# Patient Record
Sex: Male | Born: 2000 | Race: Black or African American | Hispanic: No | Marital: Single | Smoking: Current every day smoker
Health system: Southern US, Community
[De-identification: ages and names within clinical notes are randomized; demographics above are authoritative.]

## PROBLEM LIST (undated history)

## (undated) DIAGNOSIS — F329 Major depressive disorder, single episode, unspecified: Secondary | ICD-10-CM

## (undated) DIAGNOSIS — F32A Depression, unspecified: Secondary | ICD-10-CM

## (undated) DIAGNOSIS — F419 Anxiety disorder, unspecified: Secondary | ICD-10-CM

---
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
## 2020-05-10 ENCOUNTER — Other Ambulatory Visit (HOSPITAL_COMMUNITY): Payer: Self-pay | Admitting: Psychiatry

## 2020-05-10 DIAGNOSIS — F331 Major depressive disorder, recurrent, moderate: Secondary | ICD-10-CM

## 2020-07-09 IMAGING — DX DG CHEST 1V PORT
1 series · 1 of 1 positions shown · non-contrast
Comparison: 10/04/2018

CLINICAL DATA: Chest tube, pneumothorax

EXAM:
PORTABLE CHEST 1 VIEW

[chest ap]
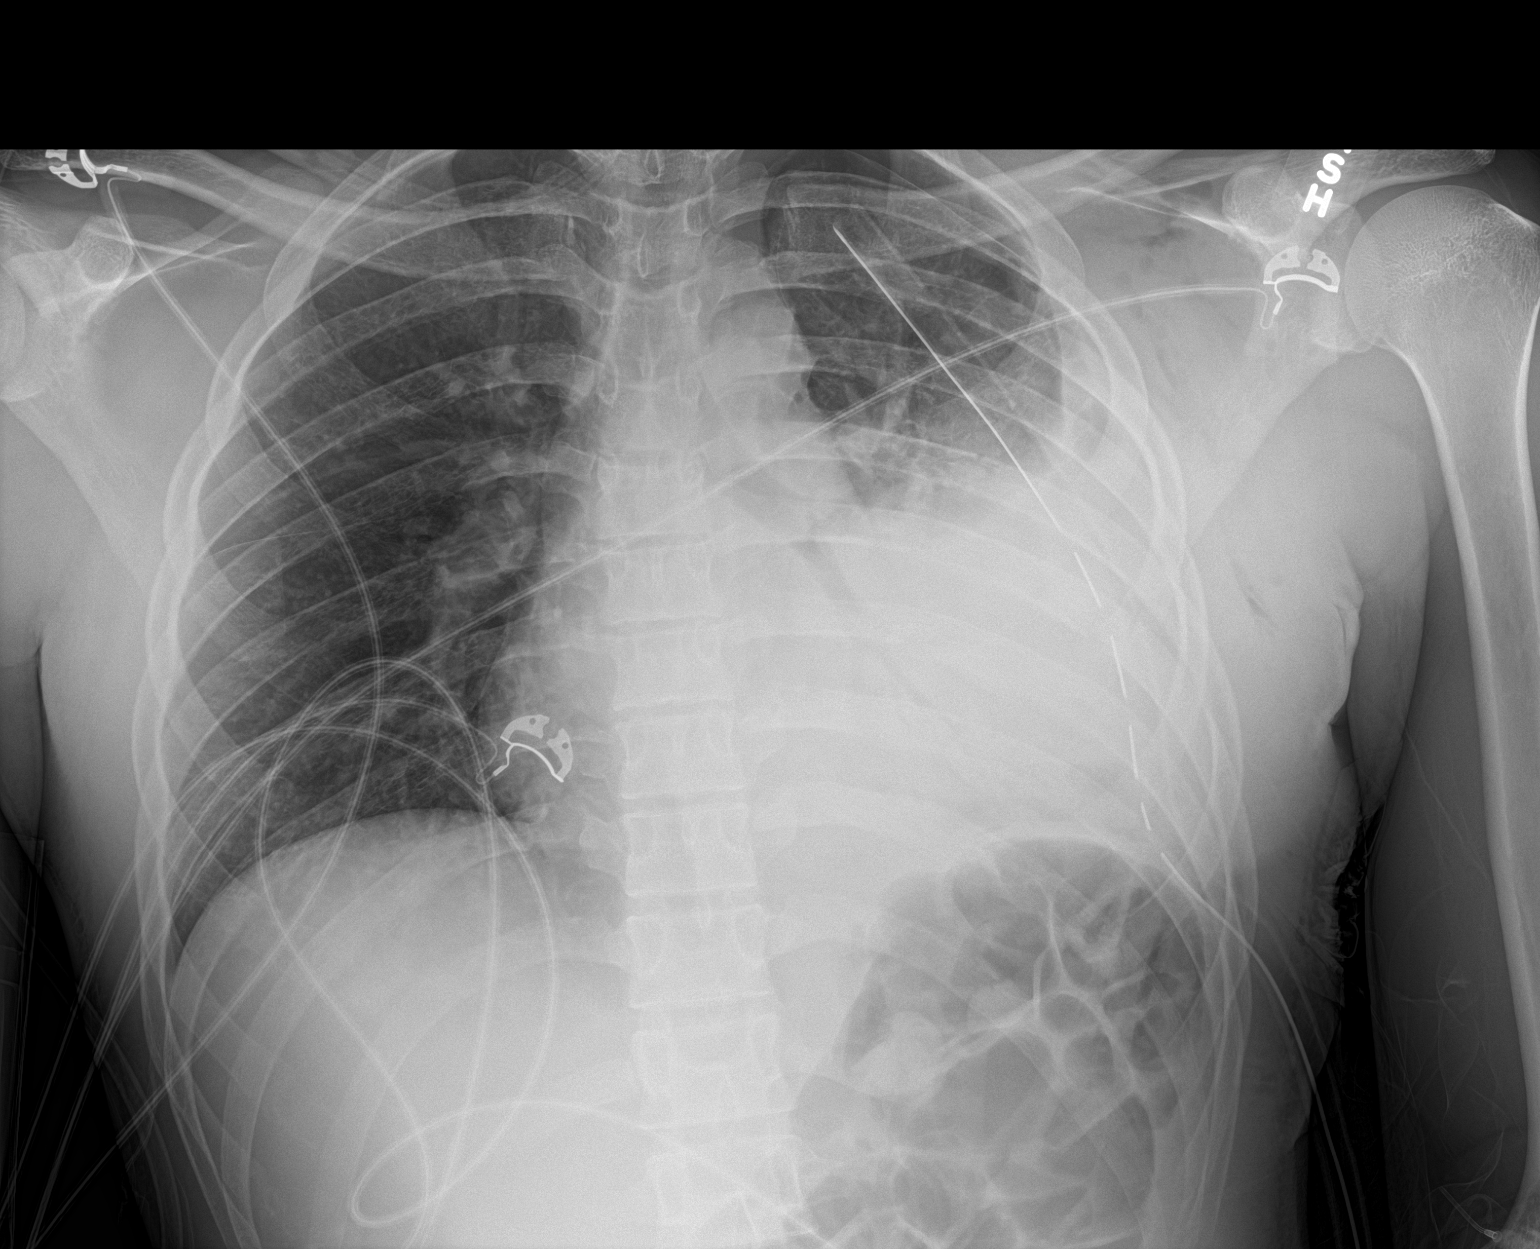

[1 of 1 positions shown; findings below may reference images not displayed]

FINDINGS: Very slight interval increase in a small left apical pneumothorax
component, 5-10%. Left-sided chest tube remains in position with a
large left pleural effusion and extensive atelectasis or
consolidation of the left lung. Right lung is normally aerated.
Heart and mediastinum are unremarkable.
IMPRESSION: Very slight interval increase in a small left apical pneumothorax
component, 5-10%. Left-sided chest tube remains in position with a
large left pleural effusion and extensive atelectasis or
consolidation of the left lung.

## 2020-07-10 IMAGING — DX DG CHEST 1V PORT
1 series · 1 of 1 positions shown · non-contrast
Comparison: 10/05/2018

CLINICAL DATA: Follow-up chest tube

EXAM:
PORTABLE CHEST 1 VIEW

[chest ap]
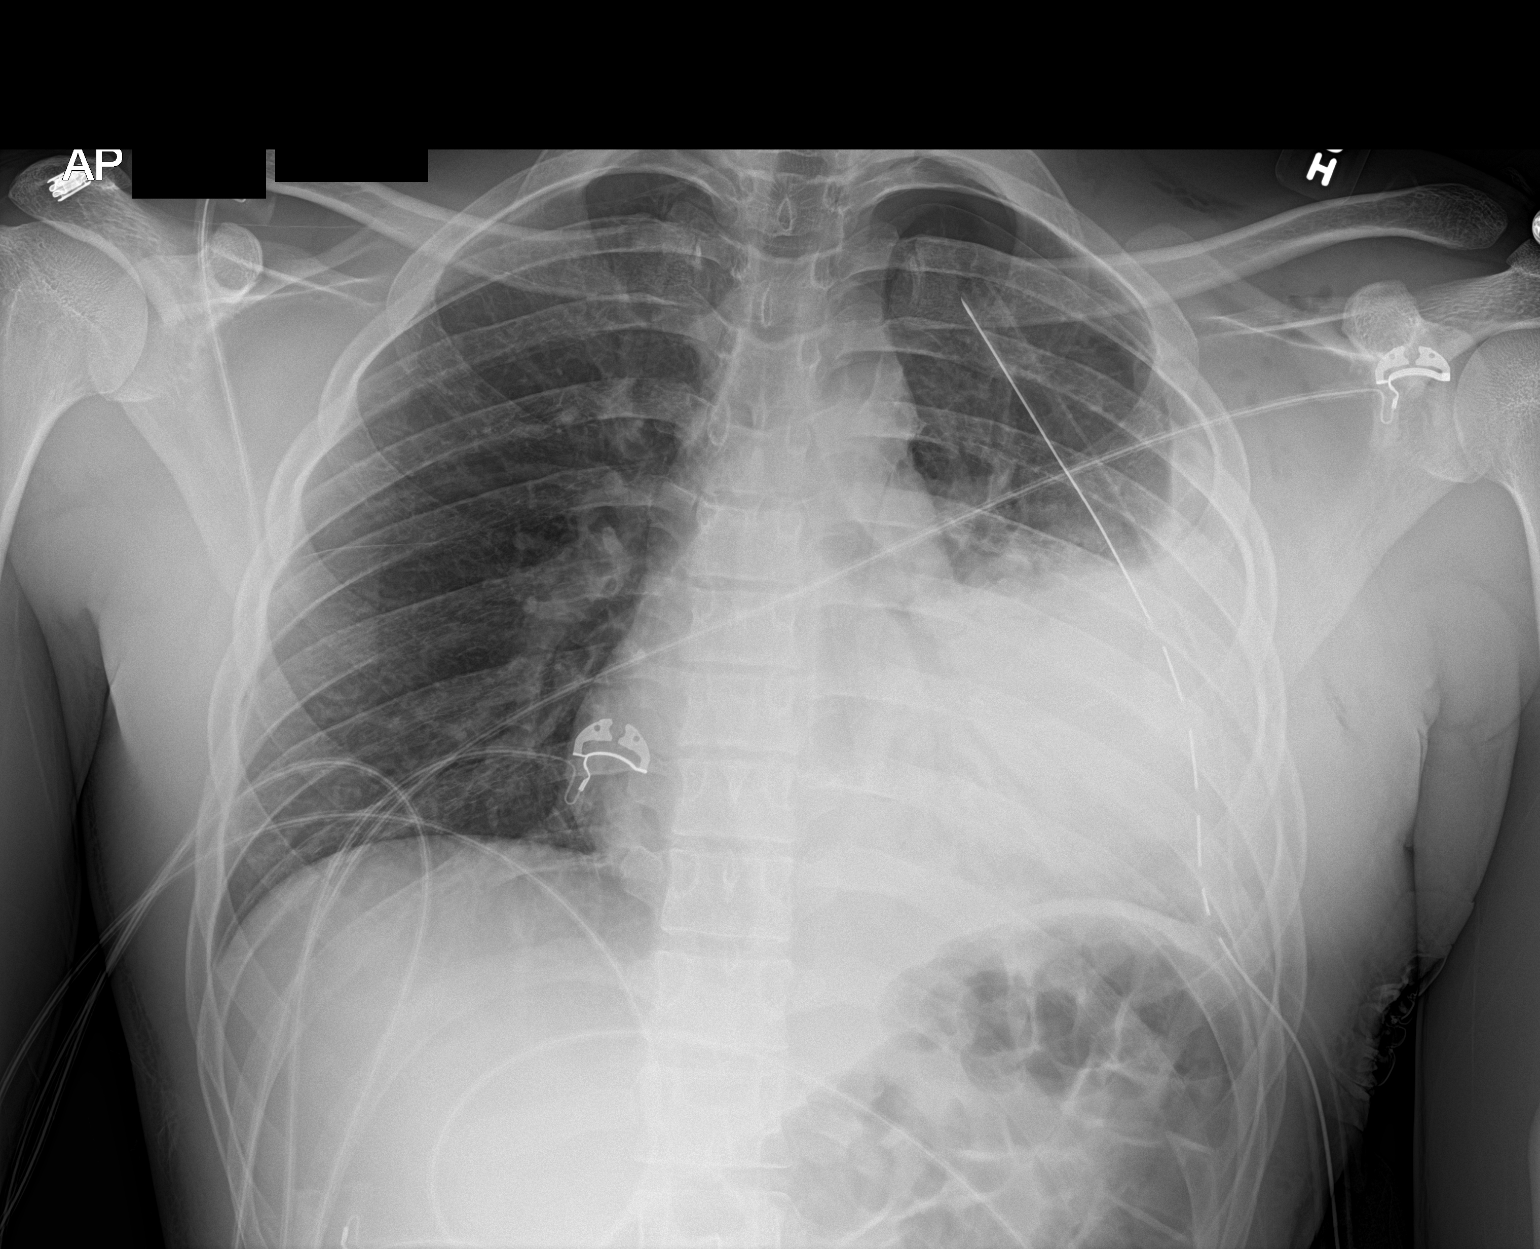

[1 of 1 positions shown; findings below may reference images not displayed]

FINDINGS: Cardiac shadow is stable. Left-sided chest tube is again noted in
satisfactory position. Persistent density in the left base is noted
consistent with the consolidation seen on recent chest CT. Small
apical pneumothorax is again identified and stable. Right lung
remains clear. No acute bony abnormality is seen.
IMPRESSION: Stable consolidation in the mid and lower left lung field.

Chest tube in place with small stable left apical pneumothorax.
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
## 2021-10-05 ENCOUNTER — Encounter (HOSPITAL_COMMUNITY): Payer: Self-pay | Admitting: *Deleted

## 2021-10-05 ENCOUNTER — Ambulatory Visit (HOSPITAL_COMMUNITY)
Admission: EM | Admit: 2021-10-05 | Discharge: 2021-10-05 | Disposition: A | Discharge status: Home / Self Care | Payer: Medicaid Other | Attending: Physician Assistant | Admitting: Physician Assistant

## 2021-10-05 DIAGNOSIS — N342 Other urethritis: Secondary | ICD-10-CM

## 2021-10-05 DIAGNOSIS — Z113 Encounter for screening for infections with a predominantly sexual mode of transmission: Secondary | ICD-10-CM
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
## 2021-10-05 NOTE — Discharge Instructions (Signed)
Lab results will be completed in 48 hours.  If you do not hear from this office within 48 hours that indicates lab tests are negative.  You can go on MyChart to be the results when they post in 48 hours. Advised to abstain from any sexual activity until after you get the results of the testing are treated if required.  Advised to use protection with all sexual contacts. Advised to follow-up with PCP or return to urgent care if symptoms fail to improve.

## 2021-10-05 NOTE — ED Triage Notes (Signed)
Patient states he started having discharge from his penis this morning. He denies any known exposures to STI.

## 2021-10-06 ENCOUNTER — Telehealth (HOSPITAL_COMMUNITY): Payer: Self-pay | Admitting: Emergency Medicine

## 2021-10-06 LAB — CYTOLOGY, (ORAL, ANAL, URETHRAL) ANCILLARY ONLY
Chlamydia: NEGATIVE
Comment: NEGATIVE
Comment: NEGATIVE
Comment: NORMAL
Neisseria Gonorrhea: NEGATIVE
Trichomonas: POSITIVE — AB

## 2021-10-06 MED ORDER — METRONIDAZOLE 500 MG PO TABS: 2000.0000 mg | ORAL_TABLET | Freq: Once | ORAL | 0 refills | Status: AC

## 2021-11-22 ENCOUNTER — Encounter (HOSPITAL_COMMUNITY): Payer: Self-pay

## 2021-11-22 ENCOUNTER — Ambulatory Visit (HOSPITAL_COMMUNITY)
Admission: EM | Admit: 2021-11-22 | Discharge: 2021-11-22 | Disposition: A | Discharge status: Home / Self Care | Payer: Medicaid Other | Attending: Family Medicine | Admitting: Family Medicine

## 2021-11-22 DIAGNOSIS — Z202 Contact with and (suspected) exposure to infections with a predominantly sexual mode of transmission: Secondary | ICD-10-CM

## 2021-11-22 MED ORDER — METRONIDAZOLE 500 MG PO TABS
2000.0000 mg | ORAL_TABLET | Freq: Once | ORAL | Status: AC
  Administered 2021-11-22: 2000 mg via ORAL

## 2021-11-22 MED ORDER — METRONIDAZOLE 500 MG PO TABS
ORAL_TABLET | ORAL | Status: AC
  Filled 2021-11-22: qty 4

## 2021-11-22 NOTE — ED Triage Notes (Signed)
Pt reports exposure to STI ([place]). Pt denies any symptoms at this time.

## 2021-11-22 NOTE — Discharge Instructions (Signed)
We have sent testing for sexually transmitted infections. We will notify you of any positive results once they are received. If required, we will prescribe any medications you might need.  Please refrain from all sexual activity for at least the next seven days.  

## 2021-11-24 LAB — CYTOLOGY, (ORAL, ANAL, URETHRAL) ANCILLARY ONLY
Chlamydia: NEGATIVE
Comment: NEGATIVE
Comment: NEGATIVE
Comment: NORMAL
Neisseria Gonorrhea: NEGATIVE
Trichomonas: NEGATIVE

## 2021-11-26 NOTE — ED Provider Notes (Signed)
[facility]   [id number] 11/22/21 Arrival Time: 7628  ASSESSMENT & PLAN:  1. STD exposure    No empiric tx.    Discharge Instructions      We have sent testing for sexually transmitted infections. We will notify you of any positive results once they are received. If required, we will prescribe any medications you might need.  Please refrain from all sexual activity for at least the next seven days.     Pending: Labs Reviewed  CYTOLOGY, (ORAL, ANAL, URETHRAL) ANCILLARY ONLY    Will notify of any positive results. Instructed to refrain from sexual activity for at least seven days.  Reviewed expectations re: course of current medical issues. Questions answered. Outlined signs and symptoms indicating need for more acute intervention. Patient verbalized understanding. After Visit Summary given.   SUBJECTIVE:  Johnny Jackson is a 21 y.o. male who possible exp to [place]. Denies: urinary frequency, dysuria, and gross hematuria. Denies penile discharge.Afebrile. No abdominal or pelvic pain. No n/v. No rashes or lesions. Reports that he is sexually active with single male partner.   OBJECTIVE:  Vitals:   11/22/21 1726  BP: 121/79  Pulse: 71  Resp: 18  Temp: 98.6 F (37 C)  TempSrc: Oral  SpO2: 99%     General appearance: alert, cooperative, appears stated age and no distress Throat: lips, mucosa, and tongue normal; teeth and gums normal Lungs: unlabored respirations; speaks full sentences without difficulty Back: no CVA tenderness; FROM at waist Abdomen: soft, non-tender GU: normal appearing genitalia Skin: warm and dry Psychological: alert and cooperative; normal mood and affect.    Labs Reviewed  CYTOLOGY, (ORAL, ANAL, URETHRAL) ANCILLARY ONLY    No Known Allergies  Past Medical History:  Diagnosis Date   Anxiety    Depression    Family History  Problem Relation Age of Onset   Hypertension Mother    CAD Neg Hx    Social History    Socioeconomic History   Marital status: Single    Spouse name: Not on file   Number of children: Not on file   Years of education: Not on file   Highest education level: Not on file  Occupational History   Not on file  Tobacco Use   Smoking status: Never   Smokeless tobacco: Never  Vaping Use   Vaping Use: Never used  Substance and Sexual Activity   Alcohol use: Not Currently   Drug use: Yes    Types: Marijuana    Comment: 3 rolled cigars daily   Sexual activity: Yes    Birth control/protection: None  Other Topics Concern   Not on file  Social History Narrative   ** Merged History Encounter **       Social Determinants of Health   Financial Resource Strain: Unknown (10/02/2018)   Overall Financial Resource Strain (CARDIA)    Difficulty of Paying Living Expenses: Patient refused  Food Insecurity: Unknown (10/02/2018)   Hunger Vital Sign    Worried About Running Out of Food in the Last Year: Patient refused    [place] in the Last Year: Patient refused  Transportation Needs: Unknown (10/02/2018)   PRAPARE - Transportation    Lack of Transportation (Medical): Patient refused    Lack of Transportation (Non-Medical): Patient refused  Physical Activity: Unknown (10/02/2018)   Exercise Vital Sign    Days of Exercise per Week: Patient refused    Minutes of Exercise per Session: Patient refused  Stress: Unknown (10/02/2018)  [place] Questionnaire    Feeling of Stress : Patient refused  Social Connections: Unknown (10/02/2018)   Social Connection and Isolation Panel [NHANES]    Frequency of Communication with Friends and Family: Patient refused    Frequency of Social Gatherings with Friends and Family: Patient refused    Attends Religious Services: Patient refused    Active Member of Clubs or Organizations: Patient refused    Attends Archivist Meetings: Patient refused    Marital Status: Patient refused   Intimate Partner Violence: Unknown (10/02/2018)   Humiliation, Afraid, Rape, and Kick questionnaire    Fear of Current or Ex-Partner: Patient refused    Emotionally Abused: Patient refused    Physically Abused: Patient refused    Sexually Abused: Patient refused           Vanessa Kick, MD 11/26/21 [phone]

## 2021-12-14 ENCOUNTER — Encounter (HOSPITAL_COMMUNITY): Payer: Self-pay

## 2021-12-14 ENCOUNTER — Ambulatory Visit (HOSPITAL_COMMUNITY)
Admission: EM | Admit: 2021-12-14 | Discharge: 2021-12-14 | Disposition: A | Discharge status: Home / Self Care | Payer: Medicaid Other | Attending: Emergency Medicine | Admitting: Emergency Medicine

## 2021-12-14 DIAGNOSIS — R369 Urethral discharge, unspecified: Secondary | ICD-10-CM | POA: Insufficient documentation

## 2021-12-14 LAB — POCT URINALYSIS DIPSTICK, ED / UC
Bilirubin Urine: NEGATIVE
Glucose, UA: NEGATIVE mg/dL
Ketones, ur: NEGATIVE mg/dL
Leukocytes,Ua: NEGATIVE
Nitrite: NEGATIVE
Protein, ur: NEGATIVE mg/dL
Specific Gravity, Urine: 1.02 (ref 1.005–1.030)
Urobilinogen, UA: 0.2 mg/dL (ref 0.0–1.0)
pH: 6.5 (ref 5.0–8.0)

## 2021-12-14 NOTE — Discharge Instructions (Addendum)
Urinalysis negative  Labs pending 2-3 days, you will be contacted if positive for any sti and treatment will be sent to the pharmacy, you will have to return to the clinic if positive for gonorrhea to receive treatment   Please refrain from having sex until labs results, if positive please refrain from having sex until treatment complete and symptoms resolve   If positive for Chlamydia  gonorrhea or trichomoniasis please notify partner or partners so they may tested as well  Moving forward, it is recommended you use some form of protection against the transmission of sti infections  such as condoms or dental dams with each sexual encounter

## 2021-12-14 NOTE — ED Triage Notes (Signed)
Chief Complaint: urinary frequency with decreased output, bladder pain, penile discharge that is clear. Patient having painful; urination. Patient states that his urine has a strange smell and color. Patient having ongoing low back pain.   Onset: 3 days  Prescriptions or OTC medications tried: No
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
## 2021-12-15 ENCOUNTER — Emergency Department (HOSPITAL_COMMUNITY)
Admission: EM | Admit: 2021-12-15 | Discharge: 2021-12-15 | Disposition: A | Discharge status: Home / Self Care | Payer: Medicaid Other | Attending: Emergency Medicine | Admitting: Emergency Medicine

## 2021-12-15 ENCOUNTER — Encounter (HOSPITAL_COMMUNITY): Payer: Self-pay

## 2021-12-15 ENCOUNTER — Other Ambulatory Visit: Payer: Self-pay

## 2021-12-15 ENCOUNTER — Emergency Department (HOSPITAL_COMMUNITY): Payer: Medicaid Other

## 2021-12-15 DIAGNOSIS — R300 Dysuria: Secondary | ICD-10-CM | POA: Diagnosis not present

## 2021-12-15 DIAGNOSIS — R3915 Urgency of urination: Secondary | ICD-10-CM | POA: Insufficient documentation

## 2021-12-15 DIAGNOSIS — K8020 Calculus of gallbladder without cholecystitis without obstruction: Secondary | ICD-10-CM | POA: Diagnosis not present

## 2021-12-15 LAB — CBC WITH DIFFERENTIAL/PLATELET
Abs Immature Granulocytes: 0.02 10*3/uL (ref 0.00–0.07)
Basophils Absolute: 0.1 10*3/uL (ref 0.0–0.1)
Basophils Relative: 1 %
Eosinophils Absolute: 0.2 10*3/uL (ref 0.0–0.5)
Eosinophils Relative: 3 %
HCT: 39.1 % (ref 39.0–52.0)
Hemoglobin: 13.4 g/dL (ref 13.0–17.0)
Immature Granulocytes: 0 %
Lymphocytes Relative: 32 %
Lymphs Abs: 2.2 10*3/uL (ref 0.7–4.0)
MCH: 31.8 pg (ref 26.0–34.0)
MCHC: 34.3 g/dL (ref 30.0–36.0)
MCV: 92.7 fL (ref 80.0–100.0)
Monocytes Absolute: 0.5 10*3/uL (ref 0.1–1.0)
Monocytes Relative: 7 %
Neutro Abs: 3.9 10*3/uL (ref 1.7–7.7)
Neutrophils Relative %: 57 %
Platelets: 298 10*3/uL (ref 150–400)
RBC: 4.22 MIL/uL (ref 4.22–5.81)
RDW: 13.1 % (ref 11.5–15.5)
WBC: 6.9 10*3/uL (ref 4.0–10.5)
nRBC: 0 % (ref 0.0–0.2)

## 2021-12-15 LAB — COMPREHENSIVE METABOLIC PANEL
ALT: 27 U/L (ref 0–44)
AST: 28 U/L (ref 15–41)
Albumin: 4.2 g/dL (ref 3.5–5.0)
Alkaline Phosphatase: 60 U/L (ref 38–126)
Anion gap: 8 (ref 5–15)
BUN: 10 mg/dL (ref 6–20)
CO2: 27 mmol/L (ref 22–32)
Calcium: 9.3 mg/dL (ref 8.9–10.3)
Chloride: 105 mmol/L (ref 98–111)
Creatinine, Ser: 0.98 mg/dL (ref 0.61–1.24)
GFR, Estimated: >60 mL/min (ref >60)
Glucose, Bld: 90 mg/dL (ref 70–99)
Potassium: 4 mmol/L (ref 3.5–5.1)
Sodium: 140 mmol/L (ref 135–145)
Total Bilirubin: 0.4 mg/dL (ref 0.3–1.2)
Total Protein: 7.2 g/dL (ref 6.5–8.1)

## 2021-12-15 LAB — URINALYSIS, ROUTINE W REFLEX MICROSCOPIC
Bilirubin Urine: NEGATIVE
Glucose, UA: NEGATIVE mg/dL
Hgb urine dipstick: NEGATIVE
Ketones, ur: NEGATIVE mg/dL
Leukocytes,Ua: NEGATIVE
Nitrite: NEGATIVE
Protein, ur: NEGATIVE mg/dL
Specific Gravity, Urine: 1.009 (ref 1.005–1.030)
pH: 8 (ref 5.0–8.0)

## 2021-12-15 LAB — CYTOLOGY, (ORAL, ANAL, URETHRAL) ANCILLARY ONLY
Chlamydia: NEGATIVE
Comment: NEGATIVE
Comment: NEGATIVE
Comment: NORMAL
Neisseria Gonorrhea: NEGATIVE
Trichomonas: NEGATIVE

## 2021-12-15 MED ORDER — DOXYCYCLINE HYCLATE 100 MG PO TABS: 100.0000 mg | ORAL_TABLET | Freq: Two times a day (BID) | ORAL | 0 refills | Status: AC

## 2021-12-15 NOTE — ED Triage Notes (Signed)
Pt arrived POV from home c/o dysuria and urgency with urination that started 3 days ago.

## 2021-12-15 NOTE — ED Provider Notes (Signed)
[facility] EMERGENCY DEPARTMENT Provider Note   CSN: [id number] Arrival date & time: 12/15/21  1042     History  Chief Complaint  Patient presents with   Dysuria    Johnny Jackson is a 21 y.o. male.   Dysuria Presenting symptoms: dysuria    Patient has prior history of a thoracotomy and lobectomy back in 2020.  Pt has been having trouble with penile discharge, clear, urinary frequency. He has a urning sensation with that. No fevers. No vomiting or diarrhea. Pt did have STI testing recently. He was told it was negative.  Patient states back in October he was treated for trichomonas STI.  He subsequently was tested in November and that was negative.  Patient states the symptoms have been ongoing this past week.  He is not having any fevers.  No vomiting or diarrhea.  He does have some suprapubic lower abdominal discomfort.  Home Medications Prior to Admission medications   Medication Sig Start Date End Date Taking? Authorizing Provider  doxycycline (VIBRA-TABS) 100 MG tablet Take 1 tablet (100 mg total) by mouth 2 (two) times daily for 14 days. 12/15/21 12/29/21 Yes Dorie Rank, MD  hydrOXYzine (ATARAX/VISTARIL) 25 MG tablet Take 0.5-1 tablets (12.5-25 mg total) by mouth every 8 (eight) hours as needed for itching. 09/16/19   Jaynee Eagles, PA-C  predniSONE (DELTASONE) 20 MG tablet Take 2 tablets daily with breakfast. 09/16/19   Jaynee Eagles, PA-C  risperiDONE (RISPERDAL) 0.5 MG tablet Take 1 tablet (0.5 mg total) by mouth at bedtime. 04/11/19 04/10/20  Arfeen, Arlyce Harman, MD  traZODone (DESYREL) 50 MG tablet Take 1 tablet (50 mg total) by mouth at bedtime as needed for sleep. Patient not taking: Reported on 03/21/2019 02/16/19   Antony Blackbird, MD  venlafaxine XR (EFFEXOR XR) 75 MG 24 hr capsule Take 1 capsule (75 mg total) by mouth daily with breakfast. 04/11/19   Arfeen, Arlyce Harman, MD      Allergies    Patient has no known allergies.    Review of Systems   Review of Systems   Genitourinary:  Positive for dysuria.    Physical Exam Updated Vital Signs BP (!) 114/97 (BP Location: Left Arm)   Pulse 73   Temp 98 F (36.7 C) (Oral)   Resp 15   Ht 1.702 m ('5\' 7"'$ )   Wt 61.2 kg   SpO2 100%   BMI 21.14 kg/m  Physical Exam Vitals and nursing note reviewed.  Constitutional:      General: He is not in acute distress.    Appearance: He is well-developed.  HENT:     Head: Normocephalic and atraumatic.     Right Ear: External ear normal.     Left Ear: External ear normal.  Eyes:     General: No scleral icterus.       Right eye: No discharge.        Left eye: No discharge.     Conjunctiva/sclera: Conjunctivae normal.  Neck:     Trachea: No tracheal deviation.  Cardiovascular:     Rate and Rhythm: Normal rate and regular rhythm.  Pulmonary:     Effort: Pulmonary effort is normal. No respiratory distress.     Breath sounds: Normal breath sounds. No stridor. No wheezing or rales.  Abdominal:     General: Bowel sounds are normal. There is no distension.     Palpations: Abdomen is soft.     Tenderness: There is no abdominal tenderness. There is no guarding  or rebound.  Musculoskeletal:        General: No tenderness or deformity.     Cervical back: Neck supple.  Skin:    General: Skin is warm and dry.     Findings: No rash.  Neurological:     General: No focal deficit present.     Mental Status: He is alert.     Cranial Nerves: No cranial nerve deficit, dysarthria or facial asymmetry.     Sensory: No sensory deficit.     Motor: No abnormal muscle tone or seizure activity.     Coordination: Coordination normal.  Psychiatric:        Mood and Affect: Mood normal.     ED Results / Procedures / Treatments   Labs (all labs ordered are listed, but only abnormal results are displayed) Labs Reviewed  URINALYSIS, ROUTINE W REFLEX MICROSCOPIC - Abnormal; Notable for the following components:      Result Value   Color, Urine STRAW (*)    All other  components within normal limits  CBC WITH DIFFERENTIAL/PLATELET  COMPREHENSIVE METABOLIC PANEL    EKG None  Radiology CT Renal Stone Study  Result Date: 12/15/2021 CLINICAL DATA:  Abdominal/flank pain, stone suspected. Dysuria. Laterality not specified. EXAM: CT ABDOMEN AND PELVIS WITHOUT CONTRAST TECHNIQUE: Multidetector CT imaging of the abdomen and pelvis was performed following the standard protocol without IV contrast. RADIATION DOSE REDUCTION: This exam was performed according to the departmental dose-optimization program which includes automated exposure control, adjustment of the mA and/or kV according to patient size and/or use of iterative reconstruction technique. COMPARISON:  Limited correlation made with chest CT 10/05/2018. FINDINGS: Lower chest: Minimal scarring anteriorly at the left lung base. No significant pleural or pericardial effusion. Hepatobiliary: The liver appears unremarkable as imaged in the noncontrast state. There is a single calcified gallstone. No evidence of gallbladder wall thickening or biliary dilatation. Pancreas: Unremarkable. No pancreatic ductal dilatation or surrounding inflammatory changes. Spleen: Normal in size without focal abnormality. Adrenals/Urinary Tract: Both adrenal glands appear normal. No evidence of urinary tract calculus, suspicious renal lesion or hydronephrosis. The bladder appears unremarkable for its degree of distention. Stomach/Bowel: No enteric contrast administered. The stomach appears unremarkable for its degree of distension. No evidence of bowel wall thickening, distention or surrounding inflammatory change. The appendix appears normal. Vascular/Lymphatic: Assessment limited by lack of contrast and paucity of intra-abdominal fat. No enlarged lymph nodes or vascular abnormalities identified. Reproductive: The prostate gland appears unremarkable. Other: Probable small amount of pelvic ascites of undetermined etiology. No focal extraluminal  fluid or air collection identified. Musculoskeletal: No acute or significant osseous findings. IMPRESSION: 1. Small amount of free pelvic fluid of undetermined etiology. 2. No other acute findings or explanation for the patient's symptoms. No evidence of urinary tract calculus or hydronephrosis. 3. Cholelithiasis without evidence of cholecystitis or biliary dilatation. 4. Bowel, mesenteric and visceral evaluation limited by lack of contrast and paucity of intra-abdominal fat. Electronically Signed   By: Richardean Sale M.D.   On: 12/15/2021 12:51    Procedures Procedures    Medications Ordered in ED Medications - No data to display  ED Course/ Medical Decision Making/ A&P Clinical Course as of 12/15/21 1628  Mon Dec 15, 2021  1625 Labs reviewed.  Normal CBC metabolic panel urinalysis. [IW]  9798 CT scan findings reviewed.  No evidence of acute abnormality other than some small nonspecific fluid noted in the pelvis.  Patient does not evidence of ureteral stone or bladder inflammation [JK]  Clinical Course User Index [JK] Dorie Rank, MD                           Medical Decision Making Problems Addressed: Dysuria: acute illness or injury  Amount and/or Complexity of Data Reviewed Labs: ordered. Decision-making details documented in ED Course. Radiology: ordered and independent interpretation performed. Decision-making details documented in ED Course.  Risk Prescription drug management.   Patient resents with dysuria STI a couple months ago.  Patient has 1 week of dysuria.  His ED workup is reassuring.  He does not have any signs of urinary tract infection.  His CT scan does not show any acute abnormality in the urinary tract.  There is no evidence of appendicitis or bladder outlet obstruction.  It is possible the patient has an STI.  He did have testing performed at the urgent care yesterday.  No indication to repeat today.  He could also have a mild prostatitis and it is possible  urinalysis could be normal.  Will try him on a course of doxycycline and discussed outpatient follow-up with urology if his symptoms persist.        Final Clinical Impression(s) / ED Diagnoses Final diagnoses:  Dysuria    Rx / DC Orders ED Discharge Orders          Ordered    doxycycline (VIBRA-TABS) 100 MG tablet  2 times daily        12/15/21 1622              Dorie Rank, MD 12/15/21 [id number]

## 2021-12-15 NOTE — Discharge Instructions (Addendum)
Take the antibiotics as prescribed for a possible bladder/prostate/sti infection although the initial testing was normal..  Follow-up with your primary doctor or urologist if the symptoms do not resolve.  Return to the ER for fevers vomiting or other concerning symptoms

## 2021-12-15 NOTE — ED Provider Triage Note (Signed)
Emergency Medicine Provider Triage Evaluation Note  Johnny Jackson , a 21 y.o. male  was evaluated in triage.  Pt complains of increased urinary frequency, urgency xseveral days. C/o suprapubic and back pain. No hx of stones. STI testing pending--done at [facility]. Having clear penile discharge as well.   Review of Systems  Positive: Urinary urgency Negative: Fever chills  Physical Exam  BP 122/78 (BP Location: Right Arm)   Pulse 62   Temp 98 F (36.7 C) (Oral)   Resp 16   Ht '5\' 7"'$  (1.702 m)   Wt 61.2 kg   SpO2 100%   BMI 21.14 kg/m  Gen:   Awake, no distress   Resp:  Normal effort  MSK:   Moves extremities without difficulty     Medical Decision Making  Medically screening exam initiated at 12:06 PM.  Appropriate orders placed.  Carlosdaniel Rivadeneira was informed that the remainder of the evaluation will be completed by another provider, this initial triage assessment does not replace that evaluation, and the importance of remaining in the ED until their evaluation is complete.     Osvaldo Shipper, [place] 12/15/21 1239

## 2021-12-22 ENCOUNTER — Ambulatory Visit (HOSPITAL_COMMUNITY)
Admission: EM | Admit: 2021-12-22 | Discharge: 2021-12-22 | Disposition: A | Discharge status: Home / Self Care | Payer: Medicaid Other | Attending: Physician Assistant | Admitting: Physician Assistant

## 2021-12-22 ENCOUNTER — Encounter (HOSPITAL_COMMUNITY): Payer: Self-pay

## 2021-12-22 DIAGNOSIS — K5900 Constipation, unspecified: Secondary | ICD-10-CM | POA: Diagnosis not present

## 2021-12-22 MED ORDER — POLYETHYLENE GLYCOL 3350 17 GM/SCOOP PO POWD: ORAL | 1 refills | Status: DC

## 2021-12-22 NOTE — ED Triage Notes (Signed)
Chief Complaint: abdominal pain and constipation. Was seen in the ED and diagnosed with Gall stones. Patient had a bowel movement today was a small amount although he took 2 laxatives.   Onset: about a week.   Prescriptions or OTC medications tried: Yes- laxatives     with no relief  Sick exposure: No  New foods, medications, or products: Yes- new bladder medication   Recent Travel: No
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
## 2022-01-05 ENCOUNTER — Encounter (HOSPITAL_COMMUNITY): Payer: Self-pay

## 2022-01-05 ENCOUNTER — Ambulatory Visit (HOSPITAL_COMMUNITY)
Admission: EM | Admit: 2022-01-05 | Discharge: 2022-01-05 | Disposition: A | Discharge status: Home / Self Care | Payer: Medicaid Other | Attending: Nurse Practitioner | Admitting: Nurse Practitioner

## 2022-01-05 DIAGNOSIS — R369 Urethral discharge, unspecified: Secondary | ICD-10-CM | POA: Diagnosis present

## 2022-01-05 MED ORDER — METRONIDAZOLE 500 MG PO TABS: 500.0000 mg | ORAL_TABLET | Freq: Two times a day (BID) | ORAL | 0 refills | Status: AC

## 2022-01-05 NOTE — Discharge Instructions (Signed)
The clinic will contact you for any POSITIVE results, we do not call negative results Start metronidazole twice daily for 7 days  Abstain from sexual intercourse while on treatment and for 7 days after treatment Please have sexual partners evaluated and treated as well Follow-up with your PCP 2 to 3 days for recheck ER for any worsening symptoms

## 2022-01-05 NOTE — ED Triage Notes (Signed)
Patient with c/o burning with urination for 2 days.
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
## 2022-01-06 LAB — CYTOLOGY, (ORAL, ANAL, URETHRAL) ANCILLARY ONLY
Chlamydia: NEGATIVE
Comment: NEGATIVE
Comment: NEGATIVE
Comment: NORMAL
Neisseria Gonorrhea: NEGATIVE
Trichomonas: NEGATIVE

## 2022-01-06 LAB — POCT URINALYSIS DIPSTICK, ED / UC
Bilirubin Urine: NEGATIVE
Glucose, UA: NEGATIVE mg/dL
Hgb urine dipstick: NEGATIVE
Ketones, ur: NEGATIVE mg/dL
Leukocytes,Ua: NEGATIVE
Nitrite: NEGATIVE
Protein, ur: NEGATIVE mg/dL
Specific Gravity, Urine: 1.015 (ref 1.005–1.030)
Urobilinogen, UA: 0.2 mg/dL (ref 0.0–1.0)
pH: 8.5 — ABNORMAL HIGH (ref 5.0–8.0)

## 2022-04-24 ENCOUNTER — Encounter (HOSPITAL_COMMUNITY): Payer: Self-pay

## 2022-04-24 ENCOUNTER — Ambulatory Visit (HOSPITAL_COMMUNITY)
Admission: EM | Admit: 2022-04-24 | Discharge: 2022-04-24 | Disposition: A | Discharge status: Home / Self Care | Payer: Medicaid Other | Attending: Emergency Medicine | Admitting: Emergency Medicine

## 2022-04-24 DIAGNOSIS — R300 Dysuria: Secondary | ICD-10-CM | POA: Insufficient documentation

## 2022-04-24 DIAGNOSIS — R369 Urethral discharge, unspecified: Secondary | ICD-10-CM | POA: Diagnosis present

## 2022-04-24 LAB — POCT URINALYSIS DIP (MANUAL ENTRY)
Bilirubin, UA: NEGATIVE
Blood, UA: NEGATIVE
Glucose, UA: NEGATIVE mg/dL
Ketones, POC UA: NEGATIVE mg/dL
Leukocytes, UA: NEGATIVE
Nitrite, UA: NEGATIVE
Protein Ur, POC: NEGATIVE mg/dL
Spec Grav, UA: 1.01 (ref 1.010–1.025)
Urobilinogen, UA: 0.2 E.U./dL
pH, UA: 7 (ref 5.0–8.0)

## 2022-04-24 LAB — HIV ANTIBODY (ROUTINE TESTING W REFLEX): HIV Screen 4th Generation wRfx: NONREACTIVE

## 2022-04-24 NOTE — Discharge Instructions (Signed)
Your urine was without signs of infection.  We have tested you for sexually transmitted infections and will call if anything results is abnormal.  In the meantime, please abstain from sex.  If your results come back as positive, please notify all partners.  Please use a condom during sexual intercourse to help prevent the spread of sexually transmitted infections.  This does not prevent all infections.  Please return to clinic or follow-up with your primary care provider if you develop any new or concerning symptoms.
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
## 2022-04-25 LAB — RPR: RPR Ser Ql: NONREACTIVE

## 2022-04-27 LAB — CYTOLOGY, (ORAL, ANAL, URETHRAL) ANCILLARY ONLY
Chlamydia: NEGATIVE
Comment: NEGATIVE
Comment: NEGATIVE
Comment: NORMAL
Neisseria Gonorrhea: NEGATIVE
Trichomonas: NEGATIVE

## 2022-04-28 ENCOUNTER — Ambulatory Visit (HOSPITAL_COMMUNITY)
Admission: EM | Admit: 2022-04-28 | Discharge: 2022-04-28 | Disposition: A | Discharge status: Home / Self Care | Payer: Medicaid Other | Attending: Family Medicine | Admitting: Family Medicine

## 2022-04-28 ENCOUNTER — Encounter (HOSPITAL_COMMUNITY): Payer: Self-pay | Admitting: Emergency Medicine

## 2022-04-28 DIAGNOSIS — N342 Other urethritis: Secondary | ICD-10-CM | POA: Diagnosis not present

## 2022-04-28 MED ORDER — METRONIDAZOLE 500 MG PO TABS: 2000.0000 mg | ORAL_TABLET | Freq: Once | ORAL | 0 refills | Status: AC
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
## 2022-04-28 NOTE — ED Triage Notes (Signed)
Pt c/o white penile discharge for about 3 days.  Pt reports that he was here on 4/19 and testing was negative but feels he may have done test wrong.

## 2022-04-28 NOTE — Discharge Instructions (Addendum)
Staff will notify you of anything positive on the swab  Metronidazole 500 mg--take 4 tablets together after a decent meal for 1 dose.  This medication can cause nausea and diarrhea.  Do not drink alcohol within 72 hours of taking this medication

## 2022-04-29 LAB — CYTOLOGY, (ORAL, ANAL, URETHRAL) ANCILLARY ONLY
Chlamydia: NEGATIVE
Comment: NEGATIVE
Comment: NEGATIVE
Comment: NORMAL
Neisseria Gonorrhea: NEGATIVE
Trichomonas: POSITIVE — AB

## 2022-09-06 ENCOUNTER — Ambulatory Visit (HOSPITAL_COMMUNITY)
Admission: EM | Admit: 2022-09-06 | Discharge: 2022-09-06 | Disposition: A | Discharge status: Home / Self Care | Payer: MEDICAID | Attending: Nurse Practitioner | Admitting: Nurse Practitioner

## 2022-09-06 ENCOUNTER — Encounter (HOSPITAL_COMMUNITY): Payer: Self-pay

## 2022-09-06 DIAGNOSIS — R300 Dysuria: Secondary | ICD-10-CM | POA: Insufficient documentation

## 2022-09-06 DIAGNOSIS — R4586 Emotional lability: Secondary | ICD-10-CM | POA: Diagnosis present

## 2022-09-06 DIAGNOSIS — R1012 Left upper quadrant pain: Secondary | ICD-10-CM | POA: Diagnosis present

## 2022-09-06 LAB — CBC WITH DIFFERENTIAL/PLATELET
Abs Immature Granulocytes: 0.03 10*3/uL (ref 0.00–0.07)
Basophils Absolute: 0 10*3/uL (ref 0.0–0.1)
Basophils Relative: 1 %
Eosinophils Absolute: 0.2 10*3/uL (ref 0.0–0.5)
Eosinophils Relative: 3 %
HCT: 40 % (ref 39.0–52.0)
Hemoglobin: 13.4 g/dL (ref 13.0–17.0)
Immature Granulocytes: 1 %
Lymphocytes Relative: 38 %
Lymphs Abs: 2.3 10*3/uL (ref 0.7–4.0)
MCH: 31 pg (ref 26.0–34.0)
MCHC: 33.5 g/dL (ref 30.0–36.0)
MCV: 92.6 fL (ref 80.0–100.0)
Monocytes Absolute: 0.5 10*3/uL (ref 0.1–1.0)
Monocytes Relative: 8 %
Neutro Abs: 3 10*3/uL (ref 1.7–7.7)
Neutrophils Relative %: 49 %
Platelets: 202 10*3/uL (ref 150–400)
RBC: 4.32 MIL/uL (ref 4.22–5.81)
RDW: 13.4 % (ref 11.5–15.5)
WBC: 6 10*3/uL (ref 4.0–10.5)
nRBC: 0 % (ref 0.0–0.2)

## 2022-09-06 LAB — POCT URINALYSIS DIP (MANUAL ENTRY)
Bilirubin, UA: NEGATIVE
Blood, UA: NEGATIVE
Glucose, UA: NEGATIVE mg/dL
Ketones, POC UA: NEGATIVE mg/dL
Leukocytes, UA: NEGATIVE
Nitrite, UA: NEGATIVE
Protein Ur, POC: NEGATIVE mg/dL
Spec Grav, UA: >=1.03 — AB (ref 1.010–1.025)
Urobilinogen, UA: 0.2 U/dL
pH, UA: 6 (ref 5.0–8.0)

## 2022-09-06 LAB — COMPREHENSIVE METABOLIC PANEL
ALT: 28 U/L (ref 0–44)
AST: 29 U/L (ref 15–41)
Albumin: 4.5 g/dL (ref 3.5–5.0)
Alkaline Phosphatase: 52 U/L (ref 38–126)
Anion gap: 9 (ref 5–15)
BUN: 11 mg/dL (ref 6–20)
CO2: 27 mmol/L (ref 22–32)
Calcium: 9.5 mg/dL (ref 8.9–10.3)
Chloride: 106 mmol/L (ref 98–111)
Creatinine, Ser: 1 mg/dL (ref 0.61–1.24)
GFR, Estimated: >60 mL/min (ref >60)
Glucose, Bld: 80 mg/dL (ref 70–99)
Potassium: 4.5 mmol/L (ref 3.5–5.1)
Sodium: 142 mmol/L (ref 135–145)
Total Bilirubin: 1 mg/dL (ref 0.3–1.2)
Total Protein: 7.3 g/dL (ref 6.5–8.1)

## 2022-09-06 LAB — LIPASE, BLOOD: Lipase: 21 U/L (ref 11–51)

## 2022-09-06 NOTE — Discharge Instructions (Addendum)
The urine sample today does not show signs of infection, which is great.  Will contact you if any of the testing from today comes back abnormal.  Recommend low residue diet, follow-up with PCP if symptoms persist.
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
## 2022-09-06 NOTE — ED Triage Notes (Signed)
Patient here today with c/o LUQ abd pain X 3 days. Patient has been having some loss of appetite, constipation, and bloated feeling. Patient is concerned with with his gall bladder.

## 2022-09-08 ENCOUNTER — Ambulatory Visit (HOSPITAL_COMMUNITY)
Admission: EM | Admit: 2022-09-08 | Discharge: 2022-09-08 | Disposition: A | Discharge status: Home / Self Care | Payer: MEDICAID | Attending: Emergency Medicine | Admitting: Emergency Medicine

## 2022-09-08 MED ORDER — METRONIDAZOLE 500 MG PO TABS
2000.0000 mg | ORAL_TABLET | Freq: Once | ORAL | Status: AC
  Administered 2022-09-08: 2000 mg via ORAL

## 2022-09-08 MED ORDER — METRONIDAZOLE 500 MG PO TABS
ORAL_TABLET | ORAL | Status: AC
  Filled 2022-09-08: qty 4

## 2022-09-08 NOTE — ED Provider Notes (Signed)
Seen here 9/1, positive for trichomonas Here for treatment Given 2g flagyl in clinic Nurse visit. No charge   Johnny Jackson, [place] 09/08/22 1946

## 2022-09-08 NOTE — ED Triage Notes (Signed)
Pt here for trich treatment, advised of positive test result and advised he needs to wait 7 days to have any sexual activity. Pt verbalized understanding.

## 2022-09-10 LAB — CYTOLOGY, (ORAL, ANAL, URETHRAL) ANCILLARY ONLY
Chlamydia: NEGATIVE
Comment: NEGATIVE
Comment: NEGATIVE
Comment: NORMAL
Neisseria Gonorrhea: NEGATIVE
Trichomonas: POSITIVE — AB

## 2022-09-23 ENCOUNTER — Encounter (HOSPITAL_COMMUNITY): Payer: Self-pay | Admitting: *Deleted

## 2022-09-23 ENCOUNTER — Ambulatory Visit (HOSPITAL_COMMUNITY)
Admission: EM | Admit: 2022-09-23 | Discharge: 2022-09-23 | Disposition: A | Discharge status: Home / Self Care | Payer: MEDICAID | Attending: Emergency Medicine | Admitting: Emergency Medicine

## 2022-09-23 DIAGNOSIS — Z8619 Personal history of other infectious and parasitic diseases: Secondary | ICD-10-CM

## 2022-09-23 DIAGNOSIS — R369 Urethral discharge, unspecified: Secondary | ICD-10-CM | POA: Diagnosis present

## 2022-09-23 NOTE — ED Triage Notes (Signed)
Pt states he is having white discharge from his penis. He states he hasn't had sex since his treatment for trich on 09/09/2022 and he completed all meds in clinic.   Pt states he has trouble sleeping and would like to be abel to get refills on trazodone.

## 2022-09-23 NOTE — Discharge Instructions (Signed)
  We have screened you for sexually transmitted infections today.  He declined screening for HIV and syphilis.  Our staff will contact you if anything results is positive or requires additional treatment.  Ensure you are drinking only 64 ounces of water and avoiding urinary irritants like sodas or juice.  For refills of your trazodone, risperidone or Effexor please follow-up with a behavioral health professional or primary care provider.  As Urgent Care providers, we only only can evaluate you for an episodic event; and this cannot be substituted for the continued care and monitoring by your primary care provider and/or specialist. It is not unusual that a medical condition can present itself in one way, then progress or change and lead to another impression of your medical condition. If you should have any new or worsening symptoms, please go to the closest emergency department and contact your primary physician as soon as possible for further evaluation and testing.
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
## 2022-09-24 LAB — CYTOLOGY, (ORAL, ANAL, URETHRAL) ANCILLARY ONLY
Chlamydia: NEGATIVE
Comment: NEGATIVE
Comment: NEGATIVE
Comment: NORMAL
Neisseria Gonorrhea: NEGATIVE
Trichomonas: NEGATIVE

## 2022-09-29 ENCOUNTER — Encounter (HOSPITAL_COMMUNITY): Payer: Self-pay | Admitting: Emergency Medicine

## 2022-09-29 ENCOUNTER — Ambulatory Visit (HOSPITAL_COMMUNITY)
Admission: EM | Admit: 2022-09-29 | Discharge: 2022-09-29 | Disposition: A | Discharge status: Home / Self Care | Payer: MEDICAID | Attending: Emergency Medicine | Admitting: Emergency Medicine

## 2022-09-29 DIAGNOSIS — R369 Urethral discharge, unspecified: Secondary | ICD-10-CM | POA: Diagnosis present

## 2022-09-29 NOTE — Discharge Instructions (Signed)
Your results will come back in a few days and someone will call if results are positive and prescribe medication as needed. I have sent a referral to a urologist for you to follow-up with regarding recurrent symptoms. Return here as needed.
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
## 2022-09-29 NOTE — ED Triage Notes (Signed)
Pt reports for several weeks having discharge.reports that his STD testing that had done several times comes back normal.

## 2022-09-30 LAB — CYTOLOGY, (ORAL, ANAL, URETHRAL) ANCILLARY ONLY
Chlamydia: NEGATIVE
Comment: NEGATIVE
Comment: NEGATIVE
Comment: NORMAL
Neisseria Gonorrhea: NEGATIVE
Trichomonas: NEGATIVE

## 2022-10-22 ENCOUNTER — Encounter: Payer: Self-pay | Admitting: Urology

## 2022-10-22 ENCOUNTER — Ambulatory Visit (INDEPENDENT_AMBULATORY_CARE_PROVIDER_SITE_OTHER): Payer: MEDICAID | Admitting: Urology

## 2022-10-22 VITALS — BP 111/56 | HR 68 | Ht 67.0 in | Wt 140.0 lb

## 2022-10-22 DIAGNOSIS — R369 Urethral discharge, unspecified: Secondary | ICD-10-CM

## 2022-10-22 MED ORDER — AZITHROMYCIN 250 MG PO TABS
ORAL_TABLET | ORAL | 0 refills | Status: DC
Start: 2022-10-22 — End: 2022-12-06

## 2022-10-22 NOTE — Progress Notes (Signed)
   Assessment: 1. Penile discharge      Plan: Encouraged to stay hydrated with water and avoid bladder irritants Will tx empirically with z-pac for coverage for atypical urethritis Recommended pyridium prn for urethral symptoms FU as needed  Chief Complaint: urethral irritation  History of Present Illness:  Johnny Jackson is a 22 y.o. male who is seen in consultation from [facility] for evaluation of LUTS.  Patient has had multiple sti exposures and tntc ED visits for such.  Had trich infection 09/2022- partner also treated. Here now c/o persistent mild urethral irritation with voiding.  Admits to not drinking a lot of water. Exam and UA unremarkable   Past Medical History:  Past Medical History:  Diagnosis Date   Anxiety    Depression     Past Surgical History:  Past Surgical History:  Procedure Laterality Date   THORACOTOMY/LOBECTOMY Left 10/01/2018   Procedure: Thoracotomy/Lobectomy;  Surgeon: Corliss Skains, MD;  [facility];  Service: Thoracic;  Laterality: Left;   VIDEO ASSISTED THORACOSCOPY (VATS)/DECORTICATION Left 10/01/2018   Procedure: VIDEO ASSISTED THORACOSCOPY (VATS)/DECORTICATION;  Surgeon: Corliss Skains, MD;  [facility];  Service: Thoracic;  Laterality: Left;   VIDEO BRONCHOSCOPY N/A 10/01/2018   Procedure: Video Bronchoscopy;  Surgeon: Corliss Skains, MD;  [facility];  Service: Thoracic;  Laterality: N/A;    Allergies:  No Known Allergies  Family History:  Family History  Problem Relation Age of Onset   Hypertension Mother    CAD Neg Hx     Social History:  Social History   Tobacco Use   Smoking status: Every Day    Types: Cigars   Smokeless tobacco: Never  Vaping Use   Vaping status: Every Day   Substances: Nicotine, Flavoring  Substance Use Topics   Alcohol use: Yes   Drug use: Yes    Types: Marijuana    Review of symptoms:  Constitutional:  Negative for unexplained weight  loss, night sweats, fever, chills ENT:  Negative for nose bleeds, sinus pain, painful swallowing CV:  Negative for chest pain, shortness of breath, exercise intolerance, palpitations, loss of consciousness Resp:  Negative for cough, wheezing, shortness of breath GI:  Negative for nausea, vomiting, diarrhea, bloody stools GU:  Positives noted in HPI; otherwise negative for gross hematuria, dysuria, urinary incontinence Neuro:  Negative for seizures, poor balance, limb weakness, slurred speech Psych:  Negative for lack of energy, depression, anxiety Endocrine:  Negative for polydipsia, polyuria, symptoms of hypoglycemia (dizziness, hunger, sweating) Hematologic:  Negative for anemia, purpura, petechia, prolonged or excessive bleeding, use of anticoagulants  Allergic:  Negative for difficulty breathing or choking as a result of exposure to anything; no shellfish allergy; no allergic response (rash/itch) to materials, foods  Physical exam: BP (!) 111/56   Pulse 68   Ht 5\' 7"  (1.702 m)   Wt 140 lb (63.5 kg)   BMI 21.93 kg/m  GENERAL APPEARANCE:  Well appearing, well developed, well nourished, NAD  GU:  nl phallus, testes and cords.  No hernia  Results: UA clear

## 2022-10-26 ENCOUNTER — Ambulatory Visit (HOSPITAL_COMMUNITY): Admission: EM | Admit: 2022-10-26 | Discharge: 2022-10-26 | Discharge status: Against Medical Advice | Payer: MEDICAID
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
## 2022-10-28 LAB — URINALYSIS, ROUTINE W REFLEX MICROSCOPIC
Bilirubin, UA: NEGATIVE
Glucose, UA: NEGATIVE
Ketones, UA: NEGATIVE
Leukocytes,UA: NEGATIVE
Nitrite, UA: NEGATIVE
Protein,UA: NEGATIVE
RBC, UA: NEGATIVE
Specific Gravity, UA: 1.025 (ref 1.005–1.030)
Urobilinogen, Ur: 0.2 mg/dL (ref 0.2–1.0)
pH, UA: 6.5 (ref 5.0–7.5)

## 2022-12-06 ENCOUNTER — Ambulatory Visit (HOSPITAL_COMMUNITY)
Admission: EM | Admit: 2022-12-06 | Discharge: 2022-12-06 | Disposition: A | Discharge status: Home / Self Care | Payer: MEDICAID | Attending: Family Medicine | Admitting: Family Medicine

## 2022-12-06 ENCOUNTER — Encounter (HOSPITAL_COMMUNITY): Payer: Self-pay | Admitting: Emergency Medicine

## 2022-12-06 DIAGNOSIS — N342 Other urethritis: Secondary | ICD-10-CM | POA: Diagnosis not present

## 2022-12-06 MED ORDER — METRONIDAZOLE 500 MG PO TABS: 2000.0000 mg | ORAL_TABLET | Freq: Once | ORAL | 0 refills | Status: AC

## 2022-12-06 NOTE — Discharge Instructions (Signed)
Staff will notify you if there is anything positive on the swab.  Take metronidazole 500 mg-4 tablets together by mouth 1 time, after a good sized meal.  Avoid drinking alcohol within 72 hours of taking this medication
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
## 2022-12-06 NOTE — ED Triage Notes (Signed)
Pt c/o urgent urination, burning when urinating, and discharge from penis that began this morning.

## 2022-12-07 LAB — CYTOLOGY, (ORAL, ANAL, URETHRAL) ANCILLARY ONLY
Chlamydia: NEGATIVE
Comment: NEGATIVE
Comment: NEGATIVE
Comment: NORMAL
Neisseria Gonorrhea: NEGATIVE
Trichomonas: NEGATIVE

## 2022-12-15 ENCOUNTER — Encounter (HOSPITAL_COMMUNITY): Payer: Self-pay

## 2022-12-15 ENCOUNTER — Ambulatory Visit (HOSPITAL_COMMUNITY)
Admission: EM | Admit: 2022-12-15 | Discharge: 2022-12-15 | Disposition: A | Discharge status: Home / Self Care | Payer: MEDICAID | Attending: Family Medicine | Admitting: Family Medicine

## 2022-12-15 DIAGNOSIS — R300 Dysuria: Secondary | ICD-10-CM | POA: Insufficient documentation

## 2022-12-15 DIAGNOSIS — R350 Frequency of micturition: Secondary | ICD-10-CM | POA: Insufficient documentation

## 2022-12-15 LAB — POCT URINALYSIS DIP (MANUAL ENTRY)
Bilirubin, UA: NEGATIVE
Blood, UA: NEGATIVE
Glucose, UA: NEGATIVE mg/dL
Ketones, POC UA: NEGATIVE mg/dL
Nitrite, UA: NEGATIVE
Protein Ur, POC: NEGATIVE mg/dL
Spec Grav, UA: 1.025 (ref 1.010–1.025)
Urobilinogen, UA: 0.2 U/dL
pH, UA: 7 (ref 5.0–8.0)

## 2022-12-15 MED ORDER — CIPROFLOXACIN HCL 500 MG PO TABS: 500.0000 mg | ORAL_TABLET | Freq: Two times a day (BID) | ORAL | 0 refills | Status: AC

## 2022-12-15 NOTE — Discharge Instructions (Signed)
You were seen today for continued urinary symptoms.  Your urine overall looked okay, but I will send to the lab for further testing.  Your swab will be resulted tomorrow.  In the mean time I will treat you with another antibiotic to see if helpful.  If not, and if your tests are normal, then you should follow up with a urologist.  You may call [facility] at [phone].
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
## 2022-12-15 NOTE — ED Triage Notes (Signed)
Pt presents with urinary frequency and burning with urination x 2 weeks. Pt reports he was seen on 12/1, "urine sample and swab came back clean but I am still having symptoms." Pt currently rates his pain a 7/10. Pt denies taking medications for his pain/symptoms.

## 2022-12-16 ENCOUNTER — Ambulatory Visit (HOSPITAL_COMMUNITY): Payer: MEDICAID

## 2022-12-16 LAB — URINE CULTURE: Culture: NO GROWTH

## 2022-12-17 LAB — CYTOLOGY, (ORAL, ANAL, URETHRAL) ANCILLARY ONLY
Chlamydia: NEGATIVE
Comment: NEGATIVE
Comment: NEGATIVE
Comment: NORMAL
Neisseria Gonorrhea: NEGATIVE
Trichomonas: NEGATIVE

## 2023-01-20 ENCOUNTER — Ambulatory Visit (INDEPENDENT_AMBULATORY_CARE_PROVIDER_SITE_OTHER): Payer: MEDICAID | Admitting: Urology

## 2023-01-20 VITALS — BP 128/67 | HR 77 | Ht 67.0 in | Wt 145.0 lb

## 2023-01-20 DIAGNOSIS — R369 Urethral discharge, unspecified: Secondary | ICD-10-CM | POA: Diagnosis not present

## 2023-01-20 DIAGNOSIS — R300 Dysuria: Secondary | ICD-10-CM | POA: Diagnosis not present

## 2023-01-20 NOTE — Progress Notes (Signed)
   Assessment: 1. Dysuria   2. Penile discharge     Plan: Today I again discussed proper hydration and avoidance of bladder irritants. Will send urine testing for not only Neisseria and chlamydia but also for Ureaplasma and mycoplasma.  He has been empirically treated for this previously.  I discussed with him that if this is negative the next option would be to consider cystoscopy to rule out stricture although I think that is unlikely given that he has minimal voiding symptoms except for intermittent dysuria.  Chief Complaint: Penile discharge and intermittent dysuria  HPI: Johnny Jackson is a 23 y.o. male who presents for continued evaluation of penile discharge and intermittent dysuria.  [name] urinalyses as well as STD testing for chlamydia, Neisseria, and trichomonas have been negative.  He has not had testing for mycoplasma or Ureaplasma.  He still has a scant clear urethral discharge on occasion and also has intermittent dysuria.  He states that he has dysuria not every day and it is most common in the morning.  I previously discussed with him staying adequately hydrated and avoiding bladder irritants . Please see my note dated 10/22/2022 at the time of initial visit which is summarized below. Patient has had multiple sti exposures and tntc ED visits for such.  Had trich infection 09/2022- partner also treated. When he was seen in October he c/o persistent mild urethral irritation with voiding.  Admits to not drinking a lot of water . Exam and UA unremarkable.  He was treated empirically with a Z-Pak to cover Ureaplasma and mycoplasma infection.  Since that time he has been back to the emergency room on 2 occasions.  His urinalyses have been normal and STD testing for chlamydia gonorrhea and trichomonas have been negative.  Portions of the above documentation were copied from a prior visit for review purposes only.  Allergies: No Known Allergies  PMH: Past Medical History:   Diagnosis Date   Anxiety    Depression     PSH: Past Surgical History:  Procedure Laterality Date   THORACOTOMY/LOBECTOMY Left 10/01/2018   Procedure: Thoracotomy/Lobectomy;  Surgeon: Hilarie Lovely, MD;  Location: [facility];  Service: Thoracic;  Laterality: Left;   VIDEO ASSISTED THORACOSCOPY (VATS)/DECORTICATION Left 10/01/2018   Procedure: VIDEO ASSISTED THORACOSCOPY (VATS)/DECORTICATION;  Surgeon: Hilarie Lovely, MD;  Location: [facility];  Service: Thoracic;  Laterality: Left;   VIDEO BRONCHOSCOPY N/A 10/01/2018   Procedure: Video Bronchoscopy;  Surgeon: Hilarie Lovely, MD;  Location: [facility];  Service: Thoracic;  Laterality: N/A;    SH: Social History   Tobacco Use   Smoking status: Every Day    Types: Cigars   Smokeless tobacco: Never  Vaping Use   Vaping status: Every Day   Substances: Nicotine, Flavoring  Substance Use Topics   Alcohol use: Yes   Drug use: Yes    Types: Marijuana    ROS: Constitutional:  Negative for fever, chills, weight loss CV: Negative for chest pain, previous MI, hypertension Respiratory:  Negative for shortness of breath, wheezing, sleep apnea, frequent cough GI:  Negative for nausea, vomiting, bloody stool, GERD  PE: BP 128/67   Pulse 77   Ht 5\' 7"  (1.702 m)   Wt 145 lb (65.8 kg)   BMI 22.71 kg/m  GENERAL APPEARANCE:  Well appearing, well developed, well nourished, NAD    Results: UA is negative

## 2023-01-21 LAB — URINALYSIS, ROUTINE W REFLEX MICROSCOPIC
Bilirubin, UA: NEGATIVE
Glucose, UA: NEGATIVE
Ketones, UA: NEGATIVE
Leukocytes,UA: NEGATIVE
Nitrite, UA: NEGATIVE
RBC, UA: NEGATIVE
Specific Gravity, UA: >1.03 — ABNORMAL HIGH (ref 1.005–1.030)
Urobilinogen, Ur: 0.2 mg/dL (ref 0.2–1.0)
pH, UA: 6 (ref 5.0–7.5)

## 2023-01-21 LAB — MICROSCOPIC EXAMINATION

## 2023-01-22 LAB — CT, NG, MYCOPLASMAS NAA, URINE
Chlamydia trachomatis, NAA: NEGATIVE
Mycoplasma genitalium NAA: NEGATIVE
Mycoplasma hominis NAA: NEGATIVE
Neisseria gonorrhoeae, NAA: NEGATIVE
Ureaplasma spp NAA: NEGATIVE

## 2023-03-04 ENCOUNTER — Emergency Department (HOSPITAL_COMMUNITY)
Admission: EM | Admit: 2023-03-04 | Discharge: 2023-03-04 | Disposition: A | Discharge status: Home / Self Care | Payer: MEDICAID | Attending: Emergency Medicine | Admitting: Emergency Medicine

## 2023-03-04 ENCOUNTER — Other Ambulatory Visit: Payer: Self-pay

## 2023-03-04 ENCOUNTER — Encounter (HOSPITAL_COMMUNITY): Payer: Self-pay | Admitting: *Deleted

## 2023-03-04 DIAGNOSIS — R369 Urethral discharge, unspecified: Secondary | ICD-10-CM | POA: Insufficient documentation

## 2023-03-04 DIAGNOSIS — Z5321 Procedure and treatment not carried out due to patient leaving prior to being seen by health care provider: Secondary | ICD-10-CM | POA: Diagnosis not present

## 2023-03-04 DIAGNOSIS — R350 Frequency of micturition: Secondary | ICD-10-CM | POA: Insufficient documentation

## 2023-03-04 LAB — URINALYSIS, ROUTINE W REFLEX MICROSCOPIC
Bilirubin Urine: NEGATIVE
Glucose, UA: NEGATIVE mg/dL
Hgb urine dipstick: NEGATIVE
Ketones, ur: NEGATIVE mg/dL
Leukocytes,Ua: NEGATIVE
Nitrite: NEGATIVE
Protein, ur: NEGATIVE mg/dL
Specific Gravity, Urine: 1.011 (ref 1.005–1.030)
pH: 6 (ref 5.0–8.0)

## 2023-03-04 LAB — HIV ANTIBODY (ROUTINE TESTING W REFLEX): HIV Screen 4th Generation wRfx: NONREACTIVE

## 2023-03-04 NOTE — ED Provider Triage Note (Signed)
 Emergency Medicine Provider Triage Evaluation Note  Johnny Jackson , a 23 y.o. male  was evaluated in triage.  Pt complains of urinary frequency, penile discharge.  States he has been having urinary frequency for about a week with penile discharge for the past few days.  Describes discharge as white.  Denies any penile or testicular pain.  Denies any fever, chills..  Review of Systems  Positive: See above Negative:   Physical Exam  BP 119/68 (BP Location: Right Arm)   Pulse 67   Temp 98.4 F (36.9 C)   Resp 16   SpO2 99%  Gen:   Awake, no distress   Resp:  Normal effort  MSK:   Moves extremities without difficulty  Other:    Medical Decision Making  Medically screening exam initiated at 1:38 PM.  Appropriate orders placed.  Aneesh Bredeson was informed that the remainder of the evaluation will be completed by another provider, this initial triage assessment does not replace that evaluation, and the importance of remaining in the ED until their evaluation is complete.     Peter Garter, [place] 03/04/23 [phone]

## 2023-03-04 NOTE — ED Notes (Signed)
 Patient called x3 for vitals. No answer.

## 2023-03-04 NOTE — ED Notes (Signed)
 No answer x 3, assumed elopement

## 2023-03-04 NOTE — ED Triage Notes (Signed)
 Pt is here for urinary frequency and some penile discharge and pain for a few days.  Pt would like to be checked for STD, denies any unprotected sex

## 2023-03-05 LAB — RPR: RPR Ser Ql: NONREACTIVE

## 2023-03-07 LAB — GC/CHLAMYDIA PROBE AMP (~~LOC~~) NOT AT ARMC
Chlamydia: NEGATIVE
Comment: NEGATIVE
Comment: NORMAL
Neisseria Gonorrhea: NEGATIVE
